# Patient Record
Sex: Female | Born: 1960 | Race: White | Hispanic: Refuse to answer | Marital: Married
Health system: Midwestern US, Academic
[De-identification: ages and names within clinical notes are randomized; demographics above are authoritative.]

---

## 2017-11-05 ENCOUNTER — Encounter: Admit: 2017-11-05 | Discharge: 2017-11-05 | Payer: BC Managed Care – PPO

## 2017-11-05 DIAGNOSIS — M25561 Pain in right knee: Principal | ICD-10-CM

## 2017-11-06 ENCOUNTER — Ambulatory Visit: Admit: 2017-11-06 | Discharge: 2017-11-07 | Payer: BC Managed Care – PPO

## 2017-11-06 ENCOUNTER — Encounter: Admit: 2017-11-06 | Discharge: 2017-11-06 | Payer: BC Managed Care – PPO

## 2017-11-06 ENCOUNTER — Ambulatory Visit: Admit: 2017-11-06 | Discharge: 2017-11-06 | Payer: BC Managed Care – PPO

## 2017-11-06 DIAGNOSIS — M25561 Pain in right knee: Principal | ICD-10-CM

## 2017-11-06 NOTE — Progress Notes
Date of Service: 11/06/2017    Subjective:                History of Present Illness    Amanda Rice is a 56 y.o. female.    Date of Injury: several months ago    Location: bilateral knee, R>L    Description:  She is here for bilateral knee pain.  She admits to R knee locking on her.  Admits to pain in both knees and swelling.  Denies any inciting trauma.  Pain is described as a burning sensation.  Located on the anterior medial aspect.  She feels that getting up from seated position makes it works, full extension cause more pain.  Alleviating factors include aleve.  Patient has tried therapies including none.  Patient reports a meniscal repair in the L knee repaired arthroscopically 30 years ago.      No past medical history on file.  Past Surgical History:   Procedure Laterality Date   ??? KNEE SURGERY Left 1988   ??? BACK SURGERY  2009   ??? CESAREAN SECTION       Social History     Social History   ??? Marital status: Married     Spouse name: N/A   ??? Number of children: N/A   ??? Years of education: N/A     Occupational History   ??? Not on file.     Social History Main Topics   ??? Smoking status: Never Smoker   ??? Smokeless tobacco: Never Used   ??? Alcohol use Not on file   ??? Drug use: Unknown   ??? Sexual activity: Not on file     Other Topics Concern   ??? Not on file     Social History Narrative   ??? No narrative on file     No family history on file.  No current outpatient prescriptions on file prior to visit.     No current facility-administered medications on file prior to visit.       No Known Allergies       Review of Systems  A ten-point review of systems including HEENT, cardiovascular, pulmonary, gastrointestinal, genitourinary, psychiatric, neurologic, musculoskeletal, endocrine, and integumentary are negative unless otherwise noted in the history of present illness      Objective:         ??? latanoprost (XALATAN) 0.005 % ophthalmic solution Place 1 drop into or around eye(s) at bedtime daily.     Vitals: 11/06/17 0814   BP: 183/85   Pulse: 85   Weight: 90.7 kg (200 lb)   Height: 172.7 cm (68)     Body mass index is 30.41 kg/m???.     Physical Exam  Ortho Exam  GEN: well appearing, nad  HEENT: EOMI, PERRL, MMM, no pharyngeal erythema  CV: RRR, no m/r/g, normal s1s2, 2+ radial pulses bilaterally  RESP: CTAB, no wheezes/crackles, good airway movement, symmetric  Neuro: CN2-12 intact  Psych: appropriate mood and affect  MSK:   L knee: No soft tissue swelling or ecchymosis. Full pain-free range of motion from 0??? to 120???. No intra-articular effusion. Normal strength with resisted knee flexion and knee extension. No medial or lateral facet tenderness. Negative medial and lateral patellar glides. Negative patellar apprehension test. +medial joint line tenderness. Negative Lachman's test. Negative anterior drawer. Negative posterior drawer. Negative varus and valgus stress testing at 0??? and 30???. Negative McMurray's over the medial and lateral joint lines. No tenderness over the pes anserine bursa or  Gerdy's tubercle.   R knee: No soft tissue swelling or ecchymosis. Full pain-free range of motion from 0??? to 115???. No intra-articular effusion. Normal strength with resisted knee flexion and knee extension. No medial or lateral facet tenderness. Negative medial and lateral patellar glides. Negative patellar apprehension test. +medial joint line tenderness. Negative Lachman's test. Negative anterior drawer. Negative posterior drawer. Negative varus and valgus stress testing at 0??? and 30???. Negative McMurray's over the medial and lateral joint lines. No tenderness over the pes anserine bursa or Gerdy's tubercle.        Assessment and Plan:  1. Mechanical pain of right knee  MRI LOWER EXT JNT WO CONT RIGHT     - xray reviewed, she has more medial joint space narrowing in her L knee then her R, the medial joint space is relatively well preserved.  Some Patellofemoral djd appreciated bilaterally - due to her mechanical sensation of locking in her R knee and her relatively unremarkable xray, it is likely that she could have a meniscal tear  - will get MRI to confirm  - will schedule her for arthroscopy in anticipation of meniscal tear.  Will call patient with result.  However if MRI does not show meniscal tear we will have her return for possible steroid injection.

## 2017-11-07 DIAGNOSIS — M25561 Pain in right knee: Principal | ICD-10-CM

## 2017-11-07 DIAGNOSIS — M25562 Pain in left knee: ICD-10-CM

## 2017-11-20 ENCOUNTER — Encounter: Admit: 2017-11-20 | Discharge: 2017-11-21 | Payer: BC Managed Care – PPO

## 2017-11-20 DIAGNOSIS — R69 Illness, unspecified: Principal | ICD-10-CM

## 2017-11-28 ENCOUNTER — Encounter: Admit: 2017-11-28 | Discharge: 2017-11-28 | Payer: BC Managed Care – PPO

## 2017-11-28 DIAGNOSIS — S83241A Other tear of medial meniscus, current injury, right knee, initial encounter: Principal | ICD-10-CM

## 2017-11-28 DIAGNOSIS — M25561 Pain in right knee: Principal | ICD-10-CM

## 2017-11-28 MED ORDER — CEFAZOLIN INJ 1GM IVP
3 g | Freq: Once | INTRAVENOUS | 0 refills | Status: CN
Start: 2017-11-28 — End: ?

## 2017-12-14 ENCOUNTER — Encounter: Admit: 2017-12-14 | Discharge: 2017-12-14 | Payer: BC Managed Care – PPO

## 2017-12-14 DIAGNOSIS — R69 Illness, unspecified: Principal | ICD-10-CM

## 2017-12-17 ENCOUNTER — Encounter: Admit: 2017-12-17 | Discharge: 2017-12-17 | Payer: BC Managed Care – PPO

## 2017-12-17 DIAGNOSIS — H409 Unspecified glaucoma: Principal | ICD-10-CM

## 2017-12-19 ENCOUNTER — Encounter: Admit: 2017-12-19 | Discharge: 2017-12-19 | Payer: BC Managed Care – PPO

## 2017-12-24 ENCOUNTER — Encounter: Admit: 2017-12-24 | Discharge: 2017-12-24 | Payer: BC Managed Care – PPO

## 2017-12-25 ENCOUNTER — Ambulatory Visit: Admit: 2017-12-25 | Discharge: 2017-12-25 | Payer: BC Managed Care – PPO

## 2017-12-25 ENCOUNTER — Encounter: Admit: 2017-12-25 | Discharge: 2017-12-25 | Payer: BC Managed Care – PPO

## 2017-12-25 DIAGNOSIS — S83241A Other tear of medial meniscus, current injury, right knee, initial encounter: Principal | ICD-10-CM

## 2017-12-25 MED ORDER — HYDROCODONE-ACETAMINOPHEN 5-325 MG PO TAB
ORAL_TABLET | ORAL | 0 refills | 30.00000 days | Status: AC | PRN
Start: 2017-12-25 — End: 2018-01-03
  Filled 2017-12-26 (×2): qty 30, 4d supply, fill #1

## 2017-12-26 ENCOUNTER — Ambulatory Visit: Admit: 2017-12-26 | Discharge: 2017-12-26 | Payer: BC Managed Care – PPO

## 2017-12-26 ENCOUNTER — Encounter: Admit: 2017-12-26 | Discharge: 2017-12-26 | Payer: BC Managed Care – PPO

## 2017-12-26 DIAGNOSIS — S83241A Other tear of medial meniscus, current injury, right knee, initial encounter: Principal | ICD-10-CM

## 2017-12-26 DIAGNOSIS — H409 Unspecified glaucoma: Principal | ICD-10-CM

## 2017-12-26 DIAGNOSIS — M94261 Chondromalacia, right knee: ICD-10-CM

## 2017-12-26 DIAGNOSIS — S83281A Other tear of lateral meniscus, current injury, right knee, initial encounter: ICD-10-CM

## 2017-12-26 MED ORDER — ACETAMINOPHEN 500 MG PO TAB
1000 mg | Freq: Once | ORAL | 0 refills | Status: CP
Start: 2017-12-26 — End: ?

## 2017-12-26 MED ORDER — BUPIVACAINE 0.25 % (2.5 MG/ML) IJ SOLN
0 refills | Status: DC
Start: 2017-12-26 — End: 2017-12-26
  Administered 2017-12-26: 14:00:00 30 mL via INTRAMUSCULAR

## 2017-12-26 MED ORDER — PROPOFOL INJ 10 MG/ML IV VIAL
0 refills | Status: DC
Start: 2017-12-26 — End: 2017-12-26
  Administered 2017-12-26: 14:00:00 200 mg via INTRAVENOUS

## 2017-12-26 MED ORDER — ONDANSETRON HCL (PF) 4 MG/2 ML IJ SOLN
4 mg | Freq: Once | INTRAVENOUS | 0 refills | Status: DC | PRN
Start: 2017-12-26 — End: 2017-12-26

## 2017-12-26 MED ORDER — FENTANYL CITRATE (PF) 50 MCG/ML IJ SOLN
50 ug | INTRAVENOUS | 0 refills | Status: DC | PRN
Start: 2017-12-26 — End: 2017-12-26

## 2017-12-26 MED ORDER — OXYCODONE 5 MG PO TAB
5-10 mg | Freq: Once | ORAL | 0 refills | Status: CP | PRN
Start: 2017-12-26 — End: ?

## 2017-12-26 MED ORDER — LACTATED RINGERS IV SOLP
INTRAVENOUS | 0 refills | Status: DC
Start: 2017-12-26 — End: 2017-12-26
  Administered 2017-12-26: 13:00:00 1000.000 mL via INTRAVENOUS

## 2017-12-26 MED ORDER — HYDROMORPHONE (PF) 2 MG/ML IJ SYRG
.5-1 mg | INTRAVENOUS | 0 refills | Status: DC | PRN
Start: 2017-12-26 — End: 2017-12-26

## 2017-12-26 MED ORDER — FENTANYL CITRATE (PF) 50 MCG/ML IJ SOLN
0 refills | Status: DC
Start: 2017-12-26 — End: 2017-12-26
  Administered 2017-12-26 (×2): 25 ug via INTRAVENOUS

## 2017-12-26 MED ORDER — MIDAZOLAM 1 MG/ML IJ SOLN
INTRAVENOUS | 0 refills | Status: DC
Start: 2017-12-26 — End: 2017-12-26
  Administered 2017-12-26: 14:00:00 2 mg via INTRAVENOUS

## 2017-12-26 MED ORDER — HALOPERIDOL LACTATE 5 MG/ML IJ SOLN
1 mg | Freq: Once | INTRAVENOUS | 0 refills | Status: DC | PRN
Start: 2017-12-26 — End: 2017-12-26

## 2017-12-26 MED ORDER — FENTANYL CITRATE (PF) 50 MCG/ML IJ SOLN
25 ug | INTRAVENOUS | 0 refills | Status: DC | PRN
Start: 2017-12-26 — End: 2017-12-26

## 2017-12-26 MED ORDER — LIDOCAINE (PF) 10 MG/ML (1 %) IJ SOLN
.1-2 mL | INTRAMUSCULAR | 0 refills | Status: DC | PRN
Start: 2017-12-26 — End: 2017-12-26
  Administered 2017-12-26: 13:00:00 0.1 mL via INTRAMUSCULAR

## 2017-12-26 MED ORDER — GABAPENTIN 300 MG PO CAP
300 mg | Freq: Once | ORAL | 0 refills | Status: CP
Start: 2017-12-26 — End: ?
  Administered 2017-12-26: 13:00:00 300 mg via ORAL

## 2017-12-26 MED ORDER — LIDOCAINE (PF) 200 MG/10 ML (2 %) IJ SYRG
0 refills | Status: DC
Start: 2017-12-26 — End: 2017-12-26
  Administered 2017-12-26: 14:00:00 70 mg via INTRAVENOUS

## 2017-12-26 MED ORDER — CEFAZOLIN INJ 1GM IVP
2 g | Freq: Once | INTRAVENOUS | 0 refills | Status: CP
Start: 2017-12-26 — End: ?
  Administered 2017-12-26: 14:00:00 2 g via INTRAVENOUS

## 2017-12-26 MED ORDER — MEPERIDINE (PF) 25 MG/ML IJ SYRG
12.5 mg | INTRAVENOUS | 0 refills | Status: DC | PRN
Start: 2017-12-26 — End: 2017-12-26

## 2017-12-26 MED ORDER — PROMETHAZINE 25 MG/ML IJ SOLN
6.25 mg | INTRAVENOUS | 0 refills | Status: DC | PRN
Start: 2017-12-26 — End: 2017-12-26

## 2017-12-26 MED ADMIN — ACETAMINOPHEN 500 MG PO TAB [102]: 1000 mg | ORAL | @ 13:00:00 | Stop: 2017-12-26 | NDC 00904673061

## 2017-12-26 NOTE — Other
Brief Operative Note    Name: Amanda Rice is a 57 y.o. female     DOB: 01-16-1961             MRN#: [id number]  DATE OF OPERATION: 12/26/2017    Date:  12/26/2017        Preoperative Dx:   Tear of medial meniscus of right knee, current, unspecified tear type, initial encounter [S83.241A]    Post-op Diagnosis      * Tear of medial meniscus of right knee, current, unspecified tear type, initial encounter [S83.241A]    Procedure(s) (LRB):  ARTHROSCOPY KNEE WITH MEDIAL MENISCECTOMY (Right)    Anesthesia Type: General    Surgeon(s) and Role:     Lowella Grip, MD - Primary     * Wayland Salinas, [place], [place] - Assisting      Findings:  As above    Estimated Blood Loss: No blood loss documented.     Specimen(s) Removed/Disposition: * No specimens in log *    Complications:  None    Implants: None    Drains: None    Disposition:  PACU - stable    Job Founds, PA  Pager

## 2017-12-26 NOTE — H&P (View-Only)
History of Present Illness  ???  Amanda Rice is a 57 y.o. female.  ???  Date of Injury: several months ago  ???  Location: bilateral knee, R>L  ???  Description:  She is here for bilateral knee pain.  She admits to R knee locking on her.  Admits to pain in both knees and swelling.  Denies any inciting trauma.  Pain is described as a burning sensation.  Located on the anterior medial aspect.  She feels that getting up from seated position makes it works, full extension cause more pain.  Alleviating factors include aleve.  Patient has tried therapies including none.  Patient reports a meniscal repair in the L knee repaired arthroscopically 30 years ago.    ???  No past medical history on file.        Past Surgical History:   Procedure Laterality Date   ??? KNEE SURGERY Left 1988   ??? BACK SURGERY ??? 2009   ??? CESAREAN SECTION ??? ???   ???  Social History   ???        Social History   ??? Marital status: Married   ??? ??? Spouse name: N/A   ??? Number of children: N/A   ??? Years of education: N/A   ???      Occupational History   ??? Not on file.   ???       Social History Main Topics   ??? Smoking status: Never Smoker   ??? Smokeless tobacco: Never Used   ??? Alcohol use Not on file   ??? Drug use: Unknown   ??? Sexual activity: Not on file   ???       Other Topics Concern   ??? Not on file   ???      Social History Narrative   ??? No narrative on file   ???  No family history on file.  No current outpatient prescriptions on file prior to visit.   ???  No current facility-administered medications on file prior to visit.       No Known Allergies  ???  Review of Systems  A ten-point review of systems including HEENT, cardiovascular, pulmonary, gastrointestinal,???genitourinary, psychiatric, neurologic, musculoskeletal, endocrine, and integumentary are???negative unless otherwise noted in the history of present illness  ???  ???  Objective:         ??? latanoprost (XALATAN) 0.005 % ophthalmic solution Place 1 drop into or around eye(s) at bedtime daily.   ???      Vitals: ??? 11/06/17 0814   BP: 183/85   Pulse: 85   Weight: 90.7 kg (200 lb)   Height: 172.7 cm (68)   ???  Body mass index is 30.41 kg/m???.   ???  Physical Exam  Ortho Exam  GEN: well appearing, nad  HEENT: EOMI, PERRL, MMM, no pharyngeal erythema  CV: RRR, no m/r/g, normal s1s2, 2+ radial pulses bilaterally  RESP: CTAB, no wheezes/crackles, good airway movement, symmetric  Neuro: CN2-12 intact  Psych: appropriate mood and affect  MSK:   L knee: No soft tissue swelling or ecchymosis. Full pain-free range of motion from 0??? to 120???. No intra-articular effusion. Normal strength with resisted knee flexion and knee extension. No medial or lateral facet tenderness. Negative medial and lateral patellar glides. Negative patellar apprehension test. +medial joint line tenderness. Negative Lachman's test. Negative anterior drawer. Negative posterior drawer. Negative varus and valgus stress testing at 0??? and 30???. Negative McMurray's over the medial and lateral joint lines. No  tenderness over the pes anserine bursa or Gerdy's tubercle.   R knee: No soft tissue swelling or ecchymosis. Full pain-free range of motion from 0??? to 115???. No intra-articular effusion. Normal strength with resisted knee flexion and knee extension. No medial or lateral facet tenderness. Negative medial and lateral patellar glides. Negative patellar apprehension test. +medial joint line tenderness. Negative Lachman's test. Negative anterior drawer. Negative posterior drawer. Negative varus and valgus stress testing at 0??? and 30???. Negative McMurray's over the medial and lateral joint lines. No tenderness over the pes anserine bursa or Gerdy's tubercle.   ???  Assessment and Plan:  1. Mechanical pain of right knee  MRI LOWER EXT JNT WO CONT RIGHT   ???  - xray reviewed, she has more medial joint space narrowing in her L knee then her R, the medial joint space is relatively well preserved.  Some Patellofemoral djd appreciated bilaterally - due to her mechanical sensation of locking in her R knee and her relatively unremarkable xray, it is likely that she could have a meniscal tear  - will get MRI to confirm  - will schedule her for arthroscopy in anticipation of meniscal tear.  Will call patient with result.  However if MRI does not show meniscal tear we will have her return for possible steroid injection.   ???  ???  ???  ???        H&P Interval Note    Name: Amanda Rice               VQQ:[id number]     Date:12/26/2017               I have examined the patient, and there are no significant changes in their condition, from the previous H&P performed on 11/06/17.      Job Founds, PA  Pager

## 2017-12-27 ENCOUNTER — Encounter: Admit: 2017-12-27 | Discharge: 2017-12-27 | Payer: BC Managed Care – PPO

## 2017-12-28 ENCOUNTER — Encounter: Admit: 2017-12-28 | Discharge: 2017-12-28 | Payer: BC Managed Care – PPO

## 2017-12-28 DIAGNOSIS — H409 Unspecified glaucoma: Principal | ICD-10-CM

## 2018-01-03 ENCOUNTER — Ambulatory Visit: Admit: 2018-01-03 | Discharge: 2018-01-03 | Payer: BC Managed Care – PPO

## 2018-01-03 ENCOUNTER — Encounter: Admit: 2018-01-03 | Discharge: 2018-01-03 | Payer: BC Managed Care – PPO

## 2018-01-03 DIAGNOSIS — H409 Unspecified glaucoma: Principal | ICD-10-CM

## 2018-01-03 DIAGNOSIS — Z9889 Other specified postprocedural states: Principal | ICD-10-CM

## 2018-02-14 ENCOUNTER — Encounter: Admit: 2018-02-14 | Discharge: 2018-02-14 | Payer: BC Managed Care – PPO

## 2018-02-14 ENCOUNTER — Ambulatory Visit: Admit: 2018-02-14 | Discharge: 2018-02-14 | Payer: BC Managed Care – PPO

## 2018-02-14 DIAGNOSIS — Z9889 Other specified postprocedural states: Principal | ICD-10-CM

## 2018-02-14 DIAGNOSIS — H409 Unspecified glaucoma: Principal | ICD-10-CM

## 2018-02-14 DIAGNOSIS — M1711 Unilateral primary osteoarthritis, right knee: ICD-10-CM

## 2018-03-14 ENCOUNTER — Encounter: Admit: 2018-03-14 | Discharge: 2018-03-14 | Payer: BC Managed Care – PPO

## 2018-03-14 DIAGNOSIS — H409 Unspecified glaucoma: Principal | ICD-10-CM

## 2018-03-21 ENCOUNTER — Ambulatory Visit: Admit: 2018-03-21 | Discharge: 2018-03-21 | Payer: BC Managed Care – PPO

## 2018-03-21 ENCOUNTER — Encounter: Admit: 2018-03-21 | Discharge: 2018-03-21 | Payer: BC Managed Care – PPO

## 2018-03-21 DIAGNOSIS — Z9889 Other specified postprocedural states: Principal | ICD-10-CM

## 2018-03-21 DIAGNOSIS — H409 Unspecified glaucoma: Principal | ICD-10-CM

## 2018-03-21 MED ORDER — BUPIVACAINE (PF) 0.25 % (2.5 MG/ML) IJ SOLN
4 mL | Freq: Once | INTRAMUSCULAR | 0 refills | Status: CP | PRN
Start: 2018-03-21 — End: ?

## 2018-03-21 MED ORDER — METHYLPREDNISOLONE ACETATE 40 MG/ML IJ SUSP
80 mg | Freq: Once | INTRAMUSCULAR | 0 refills | Status: CP | PRN
Start: 2018-03-21 — End: ?

## 2018-03-21 MED ORDER — LIDOCAINE (PF) 10 MG/ML (1 %) IJ SOLN
4 mL | Freq: Once | INTRAMUSCULAR | 0 refills | Status: CP | PRN
Start: 2018-03-21 — End: ?

## 2018-09-12 IMAGING — MR Knee^Routine
8 of 9 series · 33 of 40 positions shown · non-contrast
Comparison: none

[Series 3: T2 fat-sat · axial · 4.0mm · 0.50mm/px · z∈[-98,+7]mm · 8 of 24 slices shown (1 of 3)]
[im 1/24]
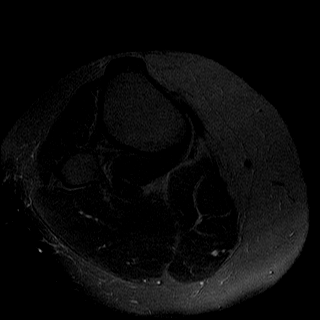
[im 4/24]
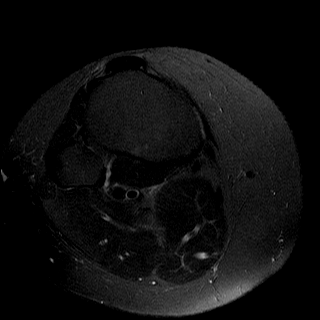
[im 7/24]
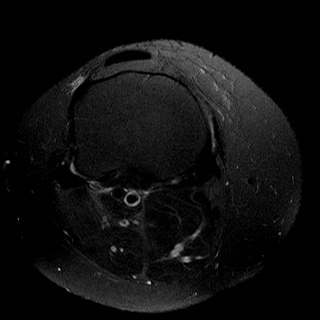
[im 10/24]
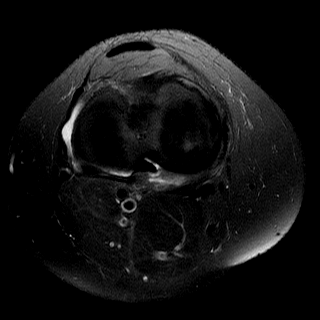
[im 14/24]
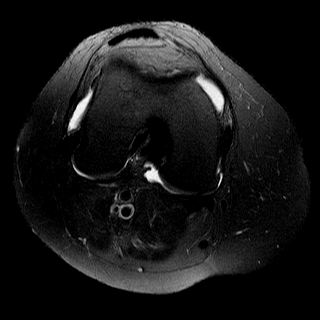
[im 17/24]
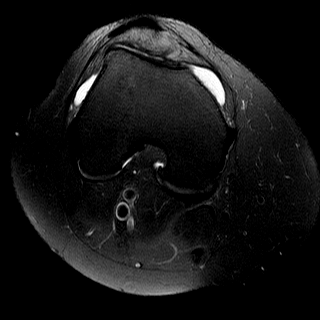
[im 20/24]
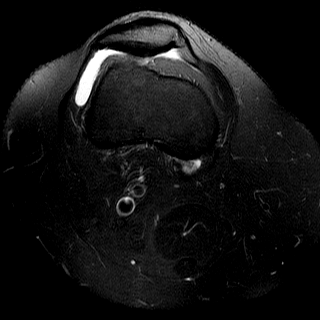
[im 24/24]
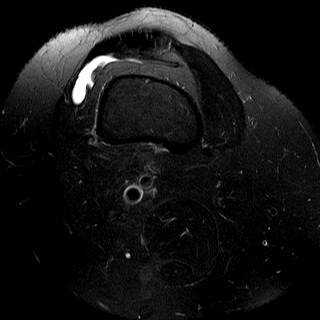

[Series 4: STIR · axial · 4.0mm · 0.29mm/px · z∈[-90,-7]mm · 3 of 9 slices shown (1 of 3)]
[im 1/9]
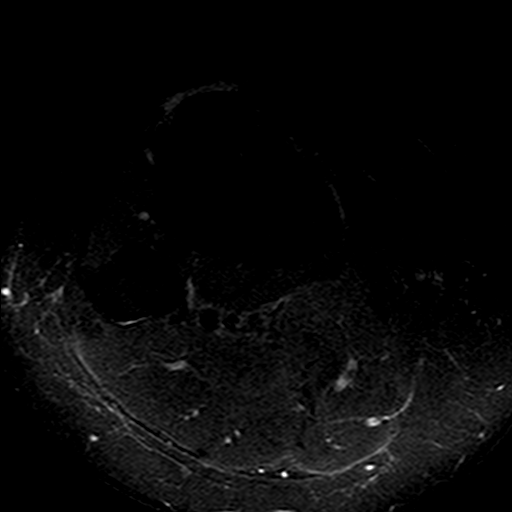
[im 5/9]
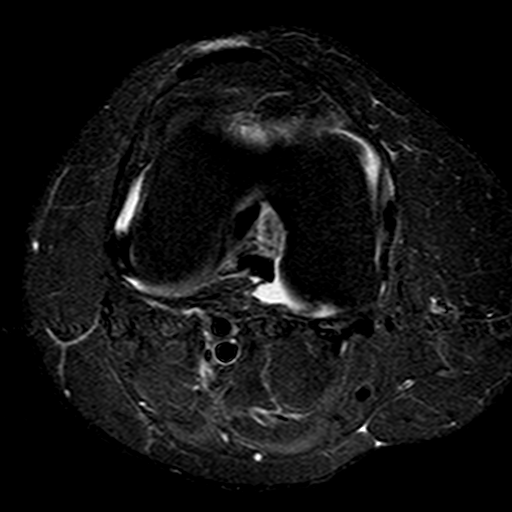
[im 9/9]
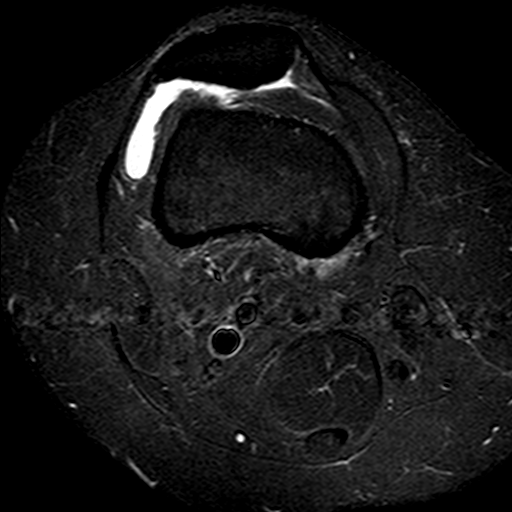

[Series 5: T1 · coronal · 4.0mm · 0.62mm/px · 8 of 28 slices shown]
[im 1/28]
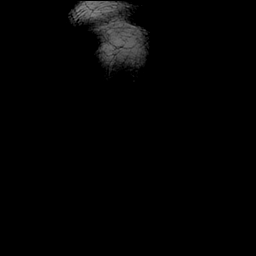
[im 4/28]
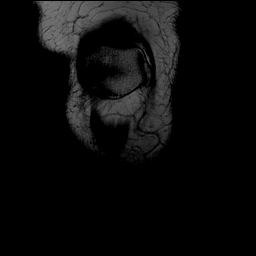
[im 7/28]
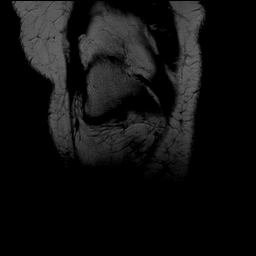
[im 11/28]
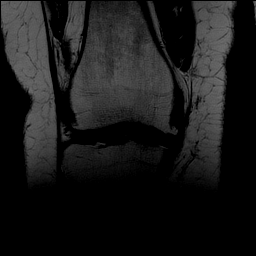
[im 17/28]
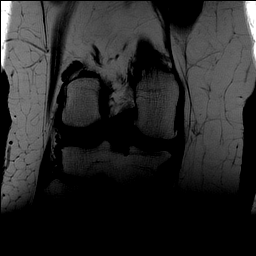
[im 21/28]
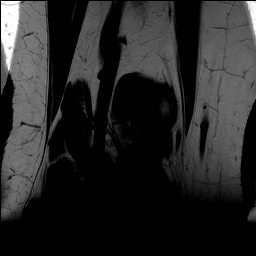
[im 24/28]
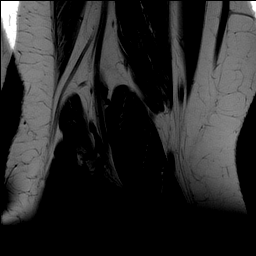
[im 28/28]
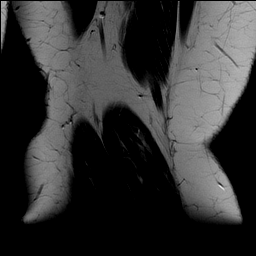

[Series 6: T2 fat-sat · coronal · 3.5mm · 0.50mm/px · 6 of 19 slices shown (2 of 3)]
[im 1/19]
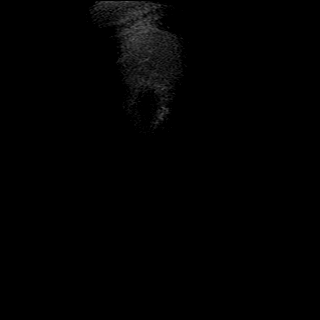
[im 4/19]
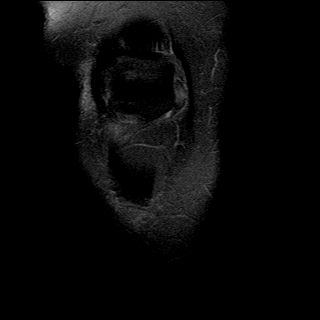
[im 8/19]
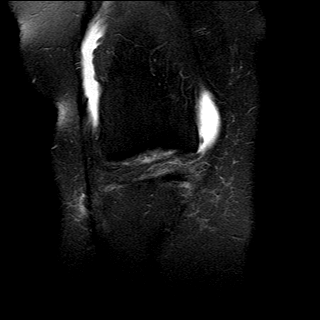
[im 11/19]
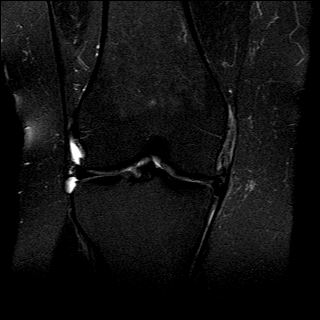
[im 15/19]
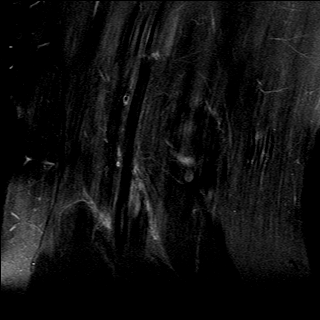
[im 19/19]
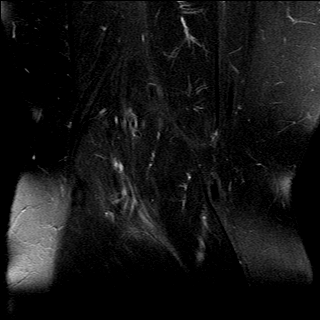

[Series 7: STIR · coronal · 4.0mm · 0.29mm/px · 3 of 10 slices shown (2 of 3)]
[im 1/10]
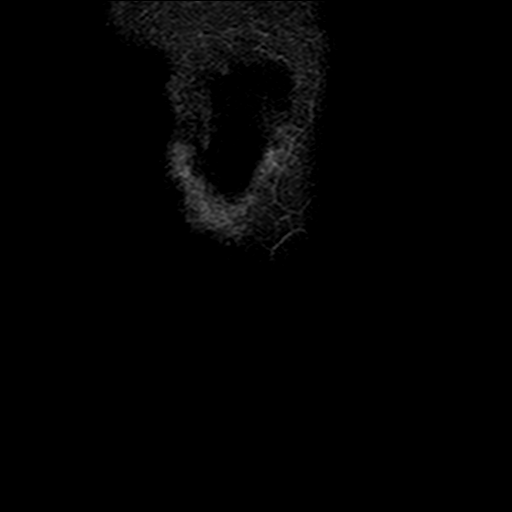
[im 5/10]
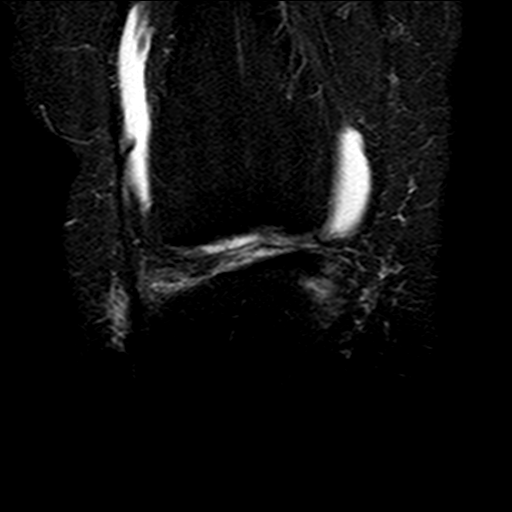
[im 10/10]
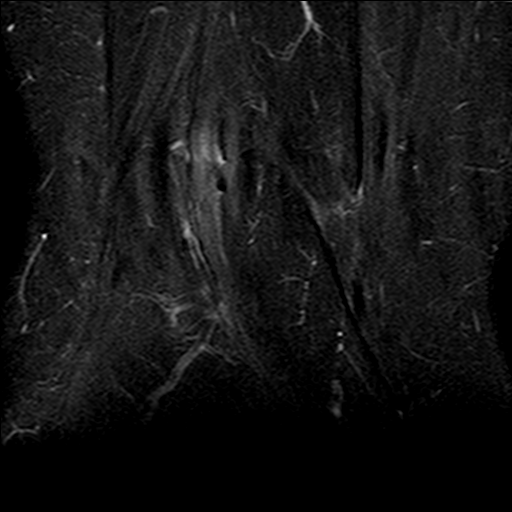

[Series 8: T2 fat-sat · sagittal · 4.0mm · 0.50mm/px · 2 of 5 slices shown (3 of 3)]
[im 1/5]
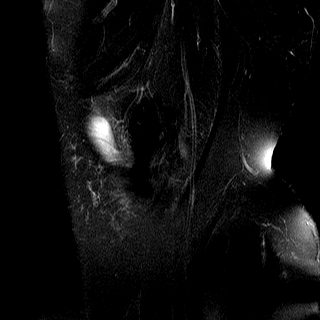
[im 5/5]
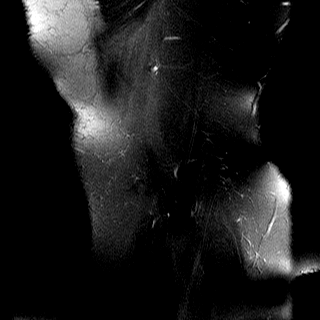

[Series 9: STIR · sagittal · 4.0mm · 0.29mm/px · 1 of 5 slices shown (3 of 3)]
[im 1/5]
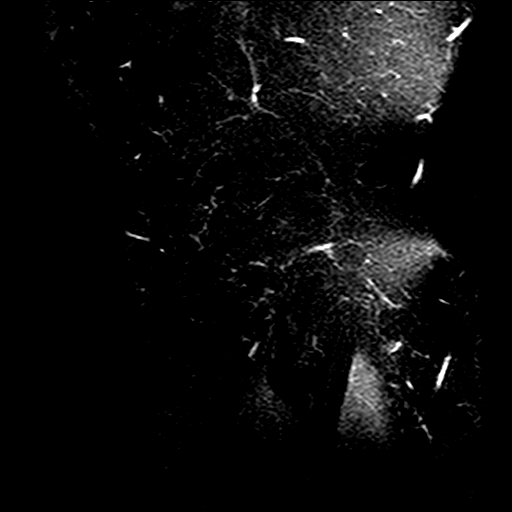

[Series 10: PD fat-sat · sagittal · 4.0mm · 0.29mm/px · 2 of 7 slices shown]
[im 1/7]
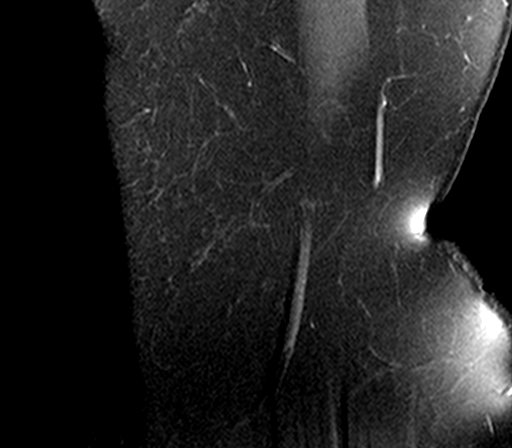
[im 7/7]
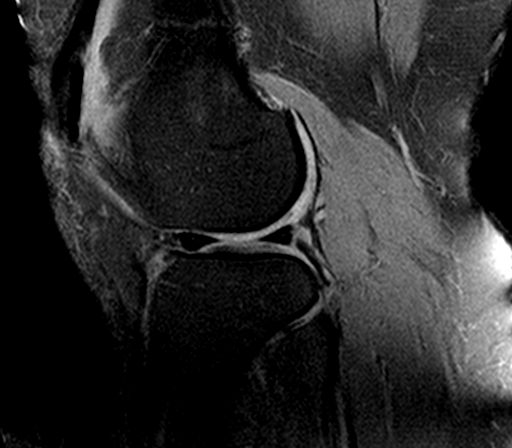

[33 of 40 positions shown; findings below may reference images not displayed]

MRI REPORT

DIAGNOSTIC STUDIES

EXAM

MAGNETIC RESONANCE IMAGING, right KNEE, ANY JOINT OF LOWER EXTREMITY; WITHOUT CONTRAST, CPT 86849

INDICATION

MECHANICAL PAIN OF RIGHT KNEE
RIGHT MEDIAL KNEE PAIN X 6 MONTHS. NO KNOWN INJURY. NO SX HX TO KNEE.
PAIN IS WORSE WITH STAIRS AND BENDING OR EXTENSIVE USE. SOME SWELLING.

TECHNIQUE

Multisequence, multiplanar MRI of the right knee was performed without contrast using standard
departmental protocol.

COMPARISONS

None

FINDINGS

Image quality is degraded by motion artifact on several sequences.

Menisci: There is a complex displaced flap tear of the body medial meniscus, flap displaced into
the medial tibial recess. There is degeneration of the posterior root ligament medial meniscus.
There is an at least sprain of the medial meniscofemoral ligament and medial meniscotibial
ligament. Lateral meniscus is intact.

Ligaments: The ACL, PCL, MCL, and lateral ligamentous complex are intact.

Extensor mechanism: The quadriceps tendon, patella, and patellar tendon are intact.

Muscles: Normal bulk and signal.

Cartilage:

Patellofemoral: There is partial and near full-thickness to full-thickness patellar articular
cartilage loss diffusely. There is at least partial-thickness fissuring of the trochlear cartilage.

Medial compartment: Partial-thickness articular cartilage loss weight-bearing medial femoral
condyle best seen on coronal image 18. Diffuse thinning of the weight-bearing cartilage.

Lateral compartment: Superficial fraying and fissuring of the weight-bearing cartilage is best
appreciated on coronal image 16 along the tibial plateau. No full-thickness defect. There is a more
focal near full-thickness to full-thickness defect along the posterior weight-bearing lateral
femoral condyle on coronal image 20 measuring up to 4 millimeter.

Bone: Tricompartmental osteophytes are evident. There is no acute fracture or malalignment. No
suspicious geographic marrow lesion or infiltrating marrow process.

Soft tissues: Small joint effusion. Neurovascular structures are unremarkable. The neurovascular
structures are unremarkable.

IMPRESSION

1. Complex displaced flap tear body medial meniscus.

2. Partial and near full-thickness to full-thickness patellar articular cartilage fissuring and
loss.

3. Partial-thickness articular cartilage loss weight-bearing lateral compartment and medial
femoral condyle. Near full-thickness to full-thickness focal defect posterior weight-bearing
lateral femoral condyle articular cartilage up to 4 millimeter.

## 2021-03-21 ENCOUNTER — Encounter: Admit: 2021-03-21 | Discharge: 2021-03-21 | Payer: BC Managed Care – PPO

## 2021-03-21 ENCOUNTER — Ambulatory Visit: Admit: 2021-03-21 | Discharge: 2021-03-21 | Payer: BC Managed Care – PPO

## 2021-03-21 DIAGNOSIS — R011 Cardiac murmur, unspecified: Secondary | ICD-10-CM
